# Patient Record
Sex: Male | Born: 1970 | Race: Black or African American | Hispanic: No | Smoking: Former smoker
Health system: Southern US, Community
[De-identification: ages and names within clinical notes are randomized; demographics above are authoritative.]

## PROBLEM LIST (undated history)

## (undated) DIAGNOSIS — IMO0002 Reserved for concepts with insufficient information to code with codable children: Secondary | ICD-10-CM

## (undated) DIAGNOSIS — K219 Gastro-esophageal reflux disease without esophagitis: Secondary | ICD-10-CM

## (undated) HISTORY — PX: BACK SURGERY: SHX140

## (undated) HISTORY — PX: OTHER SURGICAL HISTORY: SHX169

## (undated) HISTORY — PX: SPINE SURGERY: SHX786

---

## 1998-03-23 ENCOUNTER — Encounter: Payer: Self-pay | Admitting: Emergency Medicine

## 1998-03-23 ENCOUNTER — Emergency Department (HOSPITAL_COMMUNITY): Admission: EM | Admit: 1998-03-23 | Discharge: 1998-03-23 | Payer: Self-pay | Admitting: Emergency Medicine

## 1998-03-27 ENCOUNTER — Encounter: Payer: Self-pay | Admitting: Endocrinology

## 1998-03-27 ENCOUNTER — Emergency Department (HOSPITAL_COMMUNITY): Admission: EM | Admit: 1998-03-27 | Discharge: 1998-03-27 | Payer: Self-pay | Admitting: Endocrinology

## 1998-04-26 ENCOUNTER — Ambulatory Visit (HOSPITAL_COMMUNITY): Admission: RE | Admit: 1998-04-26 | Discharge: 1998-04-26 | Payer: Self-pay | Admitting: Chiropractic Medicine

## 1998-04-26 ENCOUNTER — Encounter: Payer: Self-pay | Admitting: Chiropractic Medicine

## 1998-06-10 ENCOUNTER — Ambulatory Visit (HOSPITAL_COMMUNITY): Admission: RE | Admit: 1998-06-10 | Discharge: 1998-06-10 | Payer: Self-pay | Admitting: Orthopedic Surgery

## 1998-06-10 ENCOUNTER — Encounter: Payer: Self-pay | Admitting: Orthopedic Surgery

## 1998-07-27 ENCOUNTER — Encounter: Admission: RE | Admit: 1998-07-27 | Discharge: 1998-10-25 | Payer: Self-pay | Admitting: Neurosurgery

## 1998-08-24 ENCOUNTER — Ambulatory Visit (HOSPITAL_COMMUNITY): Admission: RE | Admit: 1998-08-24 | Discharge: 1998-08-24 | Payer: Self-pay | Admitting: Neurosurgery

## 1998-09-28 ENCOUNTER — Inpatient Hospital Stay (HOSPITAL_COMMUNITY): Admission: RE | Admit: 1998-09-28 | Discharge: 1998-09-29 | Payer: Self-pay | Admitting: Neurosurgery

## 1999-01-12 ENCOUNTER — Emergency Department (HOSPITAL_COMMUNITY): Admission: EM | Admit: 1999-01-12 | Discharge: 1999-01-13 | Payer: Self-pay | Admitting: Emergency Medicine

## 1999-01-13 ENCOUNTER — Encounter: Admission: RE | Admit: 1999-01-13 | Discharge: 1999-01-13 | Payer: Self-pay | Admitting: Neurosurgery

## 1999-01-13 ENCOUNTER — Encounter: Payer: Self-pay | Admitting: Emergency Medicine

## 1999-01-31 ENCOUNTER — Ambulatory Visit (HOSPITAL_COMMUNITY): Admission: RE | Admit: 1999-01-31 | Discharge: 1999-01-31 | Payer: Self-pay | Admitting: Neurosurgery

## 1999-02-13 ENCOUNTER — Encounter: Admission: RE | Admit: 1999-02-13 | Discharge: 1999-03-02 | Payer: Self-pay | Admitting: Neurosurgery

## 2000-12-12 ENCOUNTER — Emergency Department (HOSPITAL_COMMUNITY): Admission: EM | Admit: 2000-12-12 | Discharge: 2000-12-13 | Payer: Self-pay | Admitting: Emergency Medicine

## 2000-12-13 ENCOUNTER — Encounter: Payer: Self-pay | Admitting: Emergency Medicine

## 2001-03-09 ENCOUNTER — Emergency Department (HOSPITAL_COMMUNITY): Admission: EM | Admit: 2001-03-09 | Discharge: 2001-03-09 | Payer: Self-pay | Admitting: Emergency Medicine

## 2001-03-09 ENCOUNTER — Encounter: Payer: Self-pay | Admitting: Emergency Medicine

## 2001-07-17 ENCOUNTER — Emergency Department (HOSPITAL_COMMUNITY): Admission: EM | Admit: 2001-07-17 | Discharge: 2001-07-17 | Payer: Self-pay

## 2001-08-28 ENCOUNTER — Emergency Department (HOSPITAL_COMMUNITY): Admission: EM | Admit: 2001-08-28 | Discharge: 2001-08-29 | Payer: Self-pay | Admitting: Emergency Medicine

## 2001-08-29 ENCOUNTER — Encounter: Payer: Self-pay | Admitting: Emergency Medicine

## 2001-12-08 ENCOUNTER — Emergency Department (HOSPITAL_COMMUNITY): Admission: EM | Admit: 2001-12-08 | Discharge: 2001-12-09 | Payer: Self-pay | Admitting: Emergency Medicine

## 2001-12-09 ENCOUNTER — Encounter: Payer: Self-pay | Admitting: Emergency Medicine

## 2002-01-22 ENCOUNTER — Ambulatory Visit (HOSPITAL_BASED_OUTPATIENT_CLINIC_OR_DEPARTMENT_OTHER): Admission: RE | Admit: 2002-01-22 | Discharge: 2002-01-23 | Payer: Self-pay | Admitting: Orthopedic Surgery

## 2003-11-24 ENCOUNTER — Emergency Department (HOSPITAL_COMMUNITY): Admission: EM | Admit: 2003-11-24 | Discharge: 2003-11-25 | Payer: Self-pay | Admitting: Emergency Medicine

## 2004-02-16 ENCOUNTER — Emergency Department (HOSPITAL_COMMUNITY): Admission: EM | Admit: 2004-02-16 | Discharge: 2004-02-16 | Payer: Self-pay | Admitting: Emergency Medicine

## 2004-03-31 ENCOUNTER — Emergency Department (HOSPITAL_COMMUNITY): Admission: EM | Admit: 2004-03-31 | Discharge: 2004-03-31 | Payer: Self-pay | Admitting: Emergency Medicine

## 2004-09-25 ENCOUNTER — Emergency Department (HOSPITAL_COMMUNITY): Admission: EM | Admit: 2004-09-25 | Discharge: 2004-09-25 | Payer: Self-pay | Admitting: Emergency Medicine

## 2004-10-08 ENCOUNTER — Emergency Department (HOSPITAL_COMMUNITY): Admission: EM | Admit: 2004-10-08 | Discharge: 2004-10-08 | Payer: Self-pay | Admitting: Emergency Medicine

## 2005-06-22 ENCOUNTER — Emergency Department (HOSPITAL_COMMUNITY): Admission: EM | Admit: 2005-06-22 | Discharge: 2005-06-22 | Payer: Self-pay | Admitting: Emergency Medicine

## 2006-07-10 ENCOUNTER — Emergency Department (HOSPITAL_COMMUNITY): Admission: EM | Admit: 2006-07-10 | Discharge: 2006-07-10 | Payer: Self-pay | Admitting: Family Medicine

## 2006-11-20 ENCOUNTER — Emergency Department (HOSPITAL_COMMUNITY): Admission: EM | Admit: 2006-11-20 | Discharge: 2006-11-20 | Payer: Self-pay | Admitting: Emergency Medicine

## 2006-12-08 ENCOUNTER — Emergency Department (HOSPITAL_COMMUNITY): Admission: EM | Admit: 2006-12-08 | Discharge: 2006-12-08 | Payer: Self-pay | Admitting: Emergency Medicine

## 2007-07-09 ENCOUNTER — Emergency Department (HOSPITAL_COMMUNITY): Admission: EM | Admit: 2007-07-09 | Discharge: 2007-07-09 | Payer: Self-pay | Admitting: Emergency Medicine

## 2007-07-21 ENCOUNTER — Encounter: Admission: RE | Admit: 2007-07-21 | Discharge: 2007-07-21 | Payer: Self-pay | Admitting: Sports Medicine

## 2007-07-22 ENCOUNTER — Encounter: Admission: RE | Admit: 2007-07-22 | Discharge: 2007-07-22 | Payer: Self-pay | Admitting: Sports Medicine

## 2007-09-10 ENCOUNTER — Emergency Department (HOSPITAL_COMMUNITY): Admission: EM | Admit: 2007-09-10 | Discharge: 2007-09-10 | Payer: Self-pay | Admitting: Emergency Medicine

## 2008-09-04 ENCOUNTER — Emergency Department (HOSPITAL_COMMUNITY): Admission: EM | Admit: 2008-09-04 | Discharge: 2008-09-04 | Payer: Self-pay | Admitting: Emergency Medicine

## 2009-05-24 ENCOUNTER — Emergency Department (HOSPITAL_COMMUNITY): Admission: EM | Admit: 2009-05-24 | Discharge: 2009-05-25 | Payer: Self-pay | Admitting: Emergency Medicine

## 2009-09-11 ENCOUNTER — Emergency Department (HOSPITAL_COMMUNITY): Admission: EM | Admit: 2009-09-11 | Discharge: 2009-09-11 | Payer: Self-pay | Admitting: Emergency Medicine

## 2010-05-31 ENCOUNTER — Emergency Department (HOSPITAL_COMMUNITY): Payer: 59

## 2010-05-31 ENCOUNTER — Emergency Department (HOSPITAL_COMMUNITY)
Admission: EM | Admit: 2010-05-31 | Discharge: 2010-05-31 | Disposition: A | Discharge status: Home / Self Care | Payer: 59 | Attending: Emergency Medicine | Admitting: Emergency Medicine

## 2010-05-31 DIAGNOSIS — M79609 Pain in unspecified limb: Secondary | ICD-10-CM | POA: Insufficient documentation

## 2010-05-31 DIAGNOSIS — W219XXA Striking against or struck by unspecified sports equipment, initial encounter: Secondary | ICD-10-CM | POA: Insufficient documentation

## 2010-05-31 DIAGNOSIS — S40019A Contusion of unspecified shoulder, initial encounter: Secondary | ICD-10-CM | POA: Insufficient documentation

## 2010-05-31 DIAGNOSIS — Y9367 Activity, basketball: Secondary | ICD-10-CM | POA: Insufficient documentation

## 2010-05-31 DIAGNOSIS — K219 Gastro-esophageal reflux disease without esophagitis: Secondary | ICD-10-CM | POA: Insufficient documentation

## 2010-05-31 DIAGNOSIS — S62309A Unspecified fracture of unspecified metacarpal bone, initial encounter for closed fracture: Secondary | ICD-10-CM | POA: Insufficient documentation

## 2010-05-31 DIAGNOSIS — M25519 Pain in unspecified shoulder: Secondary | ICD-10-CM | POA: Insufficient documentation

## 2010-06-04 LAB — POCT I-STAT, CHEM 8
BUN: 11 mg/dL (ref 6–23)
Calcium, Ion: 1.15 mmol/L (ref 1.12–1.32)
Chloride: 106 meq/L (ref 96–112)
Creatinine, Ser: 1.1 mg/dL (ref 0.4–1.5)
Glucose, Bld: 105 mg/dL — ABNORMAL HIGH (ref 70–99)
HCT: 44 % (ref 39.0–52.0)
Hemoglobin: 15 g/dL (ref 13.0–17.0)
Potassium: 3.8 meq/L (ref 3.5–5.1)
Sodium: 141 meq/L (ref 135–145)
TCO2: 27 mmol/L (ref 0–100)

## 2010-06-04 LAB — DIFFERENTIAL
Basophils Absolute: 0.1 K/uL (ref 0.0–0.1)
Basophils Relative: 1 % (ref 0–1)
Eosinophils Absolute: 0.3 K/uL (ref 0.0–0.7)
Eosinophils Relative: 3 % (ref 0–5)
Lymphocytes Relative: 33 % (ref 12–46)
Lymphs Abs: 3 K/uL (ref 0.7–4.0)
Monocytes Absolute: 0.5 K/uL (ref 0.1–1.0)
Monocytes Relative: 6 % (ref 3–12)
Neutro Abs: 5.2 K/uL (ref 1.7–7.7)
Neutrophils Relative %: 57 % (ref 43–77)

## 2010-06-04 LAB — CBC
HCT: 44.3 % (ref 39.0–52.0)
MCHC: 32.2 g/dL (ref 30.0–36.0)
MCV: 88.5 fL (ref 78.0–100.0)
Platelets: 234 10*3/uL (ref 150–400)
RBC: 5.01 MIL/uL (ref 4.22–5.81)
RDW: 14.3 % (ref 11.5–15.5)

## 2010-06-19 LAB — COMPREHENSIVE METABOLIC PANEL
AST: 20 U/L (ref 0–37)
BUN: 6 mg/dL (ref 6–23)
CO2: 27 mEq/L (ref 19–32)
Calcium: 9.3 mg/dL (ref 8.4–10.5)
Creatinine, Ser: 1.07 mg/dL (ref 0.4–1.5)
GFR calc Af Amer: >60 mL/min (ref >60)
GFR calc non Af Amer: >60 mL/min (ref >60)
Total Bilirubin: 0.6 mg/dL (ref 0.3–1.2)

## 2010-06-19 LAB — POCT CARDIAC MARKERS: Myoglobin, poc: 63.2 ng/mL (ref 12–200)

## 2010-06-19 LAB — DIFFERENTIAL
Basophils Absolute: 0 10*3/uL (ref 0.0–0.1)
Eosinophils Relative: 3 % (ref 0–5)
Lymphs Abs: 3 10*3/uL (ref 0.7–4.0)
Neutro Abs: 4.2 10*3/uL (ref 1.7–7.7)

## 2010-06-19 LAB — CBC
MCV: 87.5 fL (ref 78.0–100.0)
RBC: 4.93 MIL/uL (ref 4.22–5.81)
RDW: 14.1 % (ref 11.5–15.5)
WBC: 7.9 10*3/uL (ref 4.0–10.5)

## 2010-07-28 NOTE — Op Note (Signed)
NAME:  Tristan Ware, Tristan Ware                        ACCOUNT NO.:  [id number]   MEDICAL RECORD NO.:  [id number]                   PATIENT TYPE:  AMB   LOCATION:  DSC                                  FACILITY:  [facility]   PHYSICIAN:  Loreta Ave, M.D.              DATE OF BIRTH:  [birth year]   DATE OF PROCEDURE:  01/22/2002  DATE OF DISCHARGE:                                 OPERATIVE REPORT   PREOPERATIVE DIAGNOSES:  1. Extensive partial tearing central third quadriceps tendon at superior     patella.  2. Medial meniscus tear, right knee.   POSTOPERATIVE DIAGNOSES:  1. Extensive partial tearing central third quadriceps tendon at superior     patella.  2. Medial meniscus tear, right knee.   OPERATION PERFORMED:  1. Right knee examination under anesthesia and arthroscopy, partial medial     meniscectomy, right knee.  2. Open extensive debridement and then repair of patellar tendon at superior     pole of patella, right knee.   SURGEON:  Loreta Ave, M.D.   ASSISTANT:  Arlys John D. Petrarca, P.A.-C.   ANESTHESIA:  General.   ESTIMATED BLOOD LOSS:  Minimal.   TOURNIQUET TIME:  One hour.   SPECIMENS:  None.   CULTURES:  None.   COMPLICATIONS:  None.   DRESSING:  Soft compressive with knee immobilizer.   DESCRIPTION OF PROCEDURE:  The patient was brought to the operating room and  placed on the operating room in supine position.  After adequate anesthesia  had been obtained, right knee examined.  Full motion, good stability.  Slightly palpable defect top of the patella.  Negative Lachman, negative  drawer.  Tourniquet and leg holder applied.  Leg prepped and draped in the  usual sterile fashion.  Exsanguinated with elevation and Esmarch.  Tourniquet inflated to 350 mmHg.  Straight incision at the top of the  patella extending superior and inferior. Skin and subcutaneous tissue were  divided.  Middle third quadriceps tendon exposed.  Incised longitudinally,  exposing  extensive tearing of the entire middle third of the tendon almost  full thickness with some retraction.  Bony ossicles, calcification, mucinous  degeneration and inflammatory debris.  All thoroughly excised from the  entire area.  This involved almost the entire middle third of the tendon.  I  had cleanly debrided by to healthy tissue throughout.  Top of patella  treated with multiple drillings to allow later healing.  Wound copiously  irrigated.  The tendon was then reapproximated side-to-side and brought down  overtop the patella at the site of the defect.  Nice firm longitudinal  continuity throughout after repair.  I could bring it through full passive  motion without undue tension on the repair.  Wound irrigated and closed with  subcutaneous and subcuticular Vicryl.  Three portals then created, one  superolateral, one each medial and lateral parapatellar.  Inflow catheter  introduced, knee distended,  arthroscope introduced knee inspected.  A little  lateral patellofemoral tracking but no tethering.  Articular cartilage  intact throughout.  Complex tearing posterior third medial meniscus.  Bucket  handle and transverse type tear not repairable.  Posterior third saucerized  out to healthy tissue, tapered in to remaining meniscus.  Remaining knee  inspected.  Articular cartilage intact.  Previous partial lateral  meniscectomy but no new tears. Some inflammatory and adhesions that would  probably need debrided.  All recesses examined.  No other findings  appreciated.  Instruments and fluid removed.  Portals of the knee injected  with Marcaine.  The incision above injected with Marcaine. Portals closed  with nylon.  Sterile compressive dressing applied throughout.  Tourniquet  deflated and removed.  Knee immobilizer applied.  Anesthesia reversed.  Brought to recovery room.  Tolerated surgery well.  No complications.                                               Loreta Ave,  M.D.    DFM/MEDQ  D:  01/22/2002  T:  01/22/2002  Job:  [id number]

## 2010-09-03 ENCOUNTER — Emergency Department (HOSPITAL_COMMUNITY)
Admission: EM | Admit: 2010-09-03 | Discharge: 2010-09-03 | Disposition: A | Discharge status: Home / Self Care | Payer: 59 | Attending: Emergency Medicine | Admitting: Emergency Medicine

## 2010-09-03 DIAGNOSIS — R599 Enlarged lymph nodes, unspecified: Secondary | ICD-10-CM | POA: Insufficient documentation

## 2010-09-03 DIAGNOSIS — J069 Acute upper respiratory infection, unspecified: Secondary | ICD-10-CM | POA: Insufficient documentation

## 2010-09-03 DIAGNOSIS — H9209 Otalgia, unspecified ear: Secondary | ICD-10-CM | POA: Insufficient documentation

## 2011-07-31 ENCOUNTER — Encounter (HOSPITAL_COMMUNITY): Payer: Self-pay | Admitting: Emergency Medicine

## 2011-07-31 ENCOUNTER — Emergency Department (HOSPITAL_COMMUNITY)
Admission: EM | Admit: 2011-07-31 | Discharge: 2011-07-31 | Disposition: A | Discharge status: Home / Self Care | Payer: 59 | Source: Home / Self Care | Attending: Family Medicine | Admitting: Family Medicine

## 2011-07-31 DIAGNOSIS — K922 Gastrointestinal hemorrhage, unspecified: Secondary | ICD-10-CM

## 2011-07-31 HISTORY — DX: Reserved for concepts with insufficient information to code with codable children: IMO0002

## 2011-07-31 HISTORY — DX: Gastro-esophageal reflux disease without esophagitis: K21.9

## 2011-07-31 LAB — POCT I-STAT, CHEM 8
BUN: 8 mg/dL (ref 6–23)
Creatinine, Ser: 1.2 mg/dL (ref 0.50–1.35)
Glucose, Bld: 92 mg/dL (ref 70–99)
HCT: 49 % (ref 39.0–52.0)
Sodium: 143 mEq/L (ref 135–145)
TCO2: 25 mmol/L (ref 0–100)

## 2011-07-31 NOTE — Discharge Instructions (Signed)
Continue your protonix, no more smoking, ok to work as planned, see dr Loreta Ave next week for recheck, sooner if any problems.
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
## 2011-07-31 NOTE — ED Provider Notes (Signed)
History     CSN: [id number]  Arrival date & time 07/31/11  1016   First MD Initiated Contact with Patient 07/31/11 1113      Chief Complaint  Patient presents with  . Follow-up  . GI Bleeding    (Consider location/radiation/quality/duration/timing/severity/associated sxs/prior treatment) Patient is a 41 y.o. male presenting with abdominal pain. The history is provided by the patient.  Abdominal Pain The primary symptoms of the illness include abdominal pain and hematemesis. Primary symptoms comment: seen 5/15 in [place] for vomiting blood, and black stool, pt is truck driver, h/o endo and colon by dr Loreta Ave 2 yr ago --all neg. The onset of the illness was sudden. The problem has been resolved (all sx resolved, stool nl , no abd pain.).  The patient has not had a change in bowel habit. Risk factors: smoker-- quit recently.    Past Medical History  Diagnosis Date  . Ulcer   . Acid reflux     History reviewed. No pertinent past surgical history.  No family history on file.  History  Substance Use Topics  . Smoking status: Former Smoker    Quit date: 07/29/2011  . Smokeless tobacco: Not on file  . Alcohol Use: No      Review of Systems  Constitutional: Negative.   Gastrointestinal: Positive for abdominal pain and hematemesis.    Allergies  Review of patient's allergies indicates no known allergies.  Home Medications   Current Outpatient Rx  Name Route Sig Dispense Refill  . PANTOPRAZOLE SODIUM 40 MG PO TBEC Oral Take 40 mg by mouth daily.      BP 119/78  Pulse 80  Temp(Src) 98.1 F (36.7 C) (Oral)  Resp 16  SpO2 95%  Physical Exam  Nursing note and vitals reviewed. Constitutional: He is oriented to person, place, and time. He appears well-developed and well-nourished.  HENT:  Head: Normocephalic.  Mouth/Throat: Oropharynx is clear and moist.  Cardiovascular: Regular rhythm.   Pulmonary/Chest: Breath sounds normal.  Abdominal: Soft. Bowel sounds are  normal. He exhibits no distension and no mass. There is no tenderness. There is no rebound and no guarding.  Neurological: He is alert and oriented to person, place, and time.  Skin: Skin is warm and dry.    ED Course  Procedures (including critical care time)   Labs Reviewed  POCT I-STAT, CHEM 8   No results found.   1. Upper GI bleeding       MDM          Linna Hoff, MD 07/31/11 1302

## 2011-08-02 ENCOUNTER — Telehealth (HOSPITAL_COMMUNITY): Payer: Self-pay | Admitting: *Deleted

## 2011-08-02 NOTE — ED Notes (Signed)
Cindy from Dr. Kenna Gilbert office called in VM @ [phone] and asked for pt.'s recent lab results. I printed them and faxed them @ 1700 and confirmation received. I called Cindy back and left message through the answering service that it had been done. Vassie Moselle 08/02/2011

## 2011-11-06 ENCOUNTER — Emergency Department (HOSPITAL_COMMUNITY)
Admission: EM | Admit: 2011-11-06 | Discharge: 2011-11-06 | Disposition: A | Discharge status: Home / Self Care | Payer: Worker's Compensation | Attending: Emergency Medicine | Admitting: Emergency Medicine

## 2011-11-06 ENCOUNTER — Encounter (HOSPITAL_COMMUNITY): Payer: Self-pay | Admitting: Emergency Medicine

## 2011-11-06 ENCOUNTER — Emergency Department (HOSPITAL_COMMUNITY): Payer: Worker's Compensation

## 2011-11-06 DIAGNOSIS — M25519 Pain in unspecified shoulder: Secondary | ICD-10-CM | POA: Insufficient documentation

## 2011-11-06 DIAGNOSIS — S01409A Unspecified open wound of unspecified cheek and temporomandibular area, initial encounter: Secondary | ICD-10-CM | POA: Insufficient documentation

## 2011-11-06 DIAGNOSIS — Z87891 Personal history of nicotine dependence: Secondary | ICD-10-CM | POA: Insufficient documentation

## 2011-11-06 DIAGNOSIS — IMO0002 Reserved for concepts with insufficient information to code with codable children: Secondary | ICD-10-CM | POA: Insufficient documentation

## 2011-11-06 DIAGNOSIS — K219 Gastro-esophageal reflux disease without esophagitis: Secondary | ICD-10-CM | POA: Insufficient documentation

## 2011-11-06 MED ORDER — NAPROXEN 500 MG PO TABS: 500.0000 mg | ORAL_TABLET | Freq: Two times a day (BID) | ORAL | Status: DC

## 2011-11-06 MED ORDER — OXYCODONE-ACETAMINOPHEN 5-325 MG PO TABS: 1.0000 | ORAL_TABLET | Freq: Four times a day (QID) | ORAL | Status: DC | PRN

## 2011-11-06 MED ORDER — IBUPROFEN 800 MG PO TABS
800.0000 mg | ORAL_TABLET | Freq: Once | ORAL | Status: DC
  Filled 2011-11-06: qty 1

## 2011-11-06 MED ORDER — OXYCODONE-ACETAMINOPHEN 5-325 MG PO TABS
1.0000 | ORAL_TABLET | Freq: Once | ORAL | Status: DC
  Filled 2011-11-06: qty 1
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
## 2011-11-06 NOTE — ED Notes (Signed)
Pt's supervisor was speaking with pt on phone and pt handed phone to this RN. 

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
## 2011-11-07 NOTE — ED Provider Notes (Signed)
Medical screening examination/treatment/procedure(s) were performed by non-physician practitioner and as supervising physician I was immediately available for consultation/collaboration.  Lancer Thurner R. Obbie Lewallen, MD 11/07/11 0007 

## 2011-11-15 ENCOUNTER — Encounter (HOSPITAL_COMMUNITY): Payer: Self-pay | Admitting: Emergency Medicine

## 2011-11-15 ENCOUNTER — Emergency Department (HOSPITAL_COMMUNITY)
Admission: EM | Admit: 2011-11-15 | Discharge: 2011-11-15 | Disposition: A | Discharge status: Home / Self Care | Payer: Worker's Compensation | Attending: Emergency Medicine | Admitting: Emergency Medicine

## 2011-11-15 ENCOUNTER — Emergency Department (HOSPITAL_COMMUNITY): Payer: Worker's Compensation

## 2011-11-15 DIAGNOSIS — K219 Gastro-esophageal reflux disease without esophagitis: Secondary | ICD-10-CM | POA: Insufficient documentation

## 2011-11-15 DIAGNOSIS — M25519 Pain in unspecified shoulder: Secondary | ICD-10-CM | POA: Insufficient documentation

## 2011-11-15 DIAGNOSIS — Z87891 Personal history of nicotine dependence: Secondary | ICD-10-CM | POA: Insufficient documentation

## 2011-11-15 DIAGNOSIS — T07XXXA Unspecified multiple injuries, initial encounter: Secondary | ICD-10-CM

## 2011-11-15 MED ORDER — METHOCARBAMOL 500 MG PO TABS: 500.0000 mg | ORAL_TABLET | Freq: Two times a day (BID) | ORAL | Status: AC

## 2011-11-15 MED ORDER — OXYCODONE-ACETAMINOPHEN 5-325 MG PO TABS: 2.0000 | ORAL_TABLET | ORAL | Status: AC | PRN
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
## 2012-01-03 ENCOUNTER — Other Ambulatory Visit: Payer: Self-pay | Admitting: Sports Medicine

## 2012-01-03 DIAGNOSIS — M25521 Pain in right elbow: Secondary | ICD-10-CM

## 2012-01-08 ENCOUNTER — Ambulatory Visit
Admission: RE | Admit: 2012-01-08 | Discharge: 2012-01-08 | Disposition: A | Discharge status: Home / Self Care | Payer: 59 | Source: Ambulatory Visit | Attending: Sports Medicine | Admitting: Sports Medicine

## 2012-01-08 DIAGNOSIS — M25521 Pain in right elbow: Secondary | ICD-10-CM

## 2012-05-25 ENCOUNTER — Encounter (HOSPITAL_COMMUNITY): Payer: Self-pay | Admitting: *Deleted

## 2012-05-25 ENCOUNTER — Emergency Department (HOSPITAL_COMMUNITY)
Admission: EM | Admit: 2012-05-25 | Discharge: 2012-05-25 | Disposition: A | Discharge status: Home / Self Care | Payer: 59 | Source: Home / Self Care | Attending: Emergency Medicine | Admitting: Emergency Medicine

## 2012-05-25 DIAGNOSIS — S00511A Abrasion of lip, initial encounter: Secondary | ICD-10-CM

## 2012-05-25 DIAGNOSIS — IMO0002 Reserved for concepts with insufficient information to code with codable children: Secondary | ICD-10-CM

## 2012-05-25 MED ORDER — TETANUS-DIPHTH-ACELL PERTUSSIS 5-2.5-18.5 LF-MCG/0.5 IM SUSP
0.5000 mL | Freq: Once | INTRAMUSCULAR | Status: AC
  Administered 2012-05-25: 0.5 mL via INTRAMUSCULAR

## 2012-05-25 MED ORDER — TETANUS-DIPHTH-ACELL PERTUSSIS 5-2.5-18.5 LF-MCG/0.5 IM SUSP
INTRAMUSCULAR | Status: AC
  Filled 2012-05-25: qty 0.5

## 2012-05-25 NOTE — ED Provider Notes (Signed)
History     CSN: [id number]  Arrival date & time 05/25/12  1649   First MD Initiated Contact with Patient 05/25/12 1710      Chief Complaint  Patient presents with  . Oral Pain    HPI Patient is a 42 y.o. male presenting with mouth pain. The history is provided by the patient.  Oral Pain This is a new problem. The current episode started more than 2 days ago. The problem has been gradually improving. He has tried nothing for the symptoms.  Pt states he was eating at a local restaurant Friday when he noticed his (L) upper lip bleeding. He apparently cut his lip on a rough sharp edge that protruded from his fork. Since that time he continue to feel a sting at the sight. His family suggested he should be concerned since his tetanus is not UTP.   Past Medical History  Diagnosis Date  . Ulcer   . Acid reflux     Past Surgical History  Procedure Laterality Date  . Back surgery    . Bilateral knee surgery      No family history on file.  History  Substance Use Topics  . Smoking status: Former Smoker    Quit date: 07/29/2011  . Smokeless tobacco: Not on file  . Alcohol Use: No      Review of Systems  All other systems reviewed and are negative.    Allergies  Aspirin  Home Medications   Current Outpatient Rx  Name  Route  Sig  Dispense  Refill  . Multiple Vitamin (MULTIVITAMIN WITH MINERALS) TABS   Oral   Take 1 tablet by mouth daily.         . naproxen (NAPROSYN) 500 MG tablet   Oral   Take 500 mg by mouth 2 (two) times daily.           BP 130/87  Pulse 64  Temp(Src) 97.8 F (36.6 C) (Oral)  Resp 17  SpO2 100%  Physical Exam  Constitutional: He is oriented to person, place, and time. He appears well-developed and well-nourished.  HENT:  Head: Normocephalic and atraumatic.  Mouth/Throat:    Very small abrasion to inner aspect of (L) upper lip. Minimal swelling. No s&s to suggest infectious process.   Eyes: Conjunctivae are normal.  Neck:  Neck supple.  Cardiovascular: Normal rate.   Pulmonary/Chest: Effort normal.  Musculoskeletal: Normal range of motion.  Neurological: He is alert and oriented to person, place, and time.  Skin: Skin is warm and dry.  Psychiatric: He has a normal mood and affect.    ED Course  Procedures (including critical care time)  Labs Reviewed - No data to display No results found.   1. Lip abrasion, initial encounter       MDM  Abrasion to inner aspect of (L) upper lip that pt sustained on a fork w/ a rough edge on Friday. Pt essentially wants tetanus updated but also wants to know what to do for the wound. T-Dap updated. Pt instructed to swish several times a day w/ 1/2 H202 and water solution until wound closes. Pt verbalizes understanding.        Leanne Chang, NP 05/27/12 [phone]
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
## 2012-05-27 NOTE — ED Provider Notes (Signed)
Medical screening examination/treatment/procedure(s) were performed by non-physician practitioner and as supervising physician I was immediately available for consultation/collaboration.  Leslee Home, M.D.  Reuben Likes, MD 05/27/12 [phone]
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
## 2014-12-13 ENCOUNTER — Ambulatory Visit (INDEPENDENT_AMBULATORY_CARE_PROVIDER_SITE_OTHER): Payer: 59

## 2014-12-13 ENCOUNTER — Ambulatory Visit (INDEPENDENT_AMBULATORY_CARE_PROVIDER_SITE_OTHER): Payer: 59 | Admitting: Emergency Medicine

## 2014-12-13 VITALS — BP 148/80 | HR 58 | Temp 98.5°F | Resp 16 | Ht 73.0 in | Wt 234.0 lb

## 2014-12-13 DIAGNOSIS — Z202 Contact with and (suspected) exposure to infections with a predominantly sexual mode of transmission: Secondary | ICD-10-CM | POA: Diagnosis not present

## 2014-12-13 DIAGNOSIS — M79644 Pain in right finger(s): Secondary | ICD-10-CM | POA: Diagnosis not present

## 2014-12-13 DIAGNOSIS — S63632A Sprain of interphalangeal joint of right middle finger, initial encounter: Secondary | ICD-10-CM | POA: Diagnosis not present

## 2014-12-13 MED ORDER — METRONIDAZOLE 500 MG PO TABS: 2000.0000 mg | ORAL_TABLET | Freq: Once | ORAL | Status: DC

## 2014-12-13 NOTE — Patient Instructions (Signed)
Trichomoniasis Trichomoniasis is an infection caused by an organism called Trichomonas. The infection can affect both women and men. In women, the outer male genitalia and the vagina are affected. In men, the penis is mainly affected, but the prostate and other reproductive organs can also be involved. Trichomoniasis is a sexually transmitted infection (STI) and is most often passed to another person through sexual contact.  RISK FACTORS  Having unprotected sexual intercourse.  Having sexual intercourse with an infected partner. SIGNS AND SYMPTOMS  Symptoms of trichomoniasis in women include:  Abnormal gray-green frothy vaginal discharge.  Itching and irritation of the vagina.  Itching and irritation of the area outside the vagina. Symptoms of trichomoniasis in men include:   Penile discharge with or without pain.  Pain during urination. This results from inflammation of the urethra. DIAGNOSIS  Trichomoniasis may be found during a Pap test or physical exam. Your health care provider may use one of the following methods to help diagnose this infection:  Examining vaginal discharge under a microscope. For men, urethral discharge would be examined.  Testing the pH of the vagina with a test tape.  Using a vaginal swab test that checks for the Trichomonas organism. A test is available that provides results within a few minutes.  Doing a culture test for the organism. This is not usually needed. TREATMENT   You may be given medicine to fight the infection. Women should inform their health care provider if they could be or are pregnant. Some medicines used to treat the infection should not be taken during pregnancy.  Your health care provider may recommend over-the-counter medicines or creams to decrease itching or irritation.  Your sexual partner will need to be treated if infected. HOME CARE INSTRUCTIONS   Take medicines only as directed by your health care provider.  Take  over-the-counter medicine for itching or irritation as directed by your health care provider.  Do not have sexual intercourse while you have the infection.  Women should not douche or wear tampons while they have the infection.  Discuss your infection with your partner. Your partner may have gotten the infection from you, or you may have gotten it from your partner.  Have your sex partner get examined and treated if necessary.  Practice safe, informed, and protected sex.  See your health care provider for other STI testing. SEEK MEDICAL CARE IF:   You still have symptoms after you finish your medicine.  You develop abdominal pain.  You have pain when you urinate.  You have bleeding after sexual intercourse.  You develop a rash.  Your medicine makes you sick or makes you throw up (vomit). MAKE SURE YOU:  Understand these instructions.  Will watch your condition.  Will get help right away if you are not doing well or get worse. Document Released: 08/22/2000 Document Revised: 07/13/2013 Document Reviewed: 12/08/2012 ExitCare Patient Information 2015 [employer]. This information is not intended to replace advice given to you by your health care provider. Make sure you discuss any questions you have with your health care provider.  

## 2014-12-13 NOTE — Progress Notes (Signed)
Subjective:  Patient ID: Tristan Ware, male    DOB: September 10, 1970  Age: 44 y.o. MRN: [id number]  CC: Hand Pain and SEXUALLY TRANSMITTED DISEASE   HPI Tristan Ware presents  With a concern about Trichomonas. His girlfriend was treated for trichomonas he's never had an STD before any discomfort testing. He also is complaining of an injury of his right third finger that was injured by a closing door. Has pain in his PIP joint he has no deformity or ecchymosis but is rather swollen and with a decreased range of motion  History Tristan Ware has a past medical history of Ulcer and Acid reflux.   He has past surgical history that includes Back surgery and bilateral knee surgery.   His  family history is not on file.  He   reports that he quit smoking about 3 years ago. He does not have any smokeless tobacco history on file. He reports that he does not drink alcohol or use illicit drugs.  Outpatient Prescriptions Prior to Visit  Medication Sig Dispense Refill  . Multiple Vitamin (MULTIVITAMIN WITH MINERALS) TABS Take 1 tablet by mouth daily.     No facility-administered medications prior to visit.    Social History   Social History  . Marital Status: Legally Separated    Spouse Name: N/A  . Number of Children: N/A  . Years of Education: N/A   Social History Main Topics  . Smoking status: Former Smoker    Quit date: 07/29/2011  . Smokeless tobacco: None  . Alcohol Use: No  . Drug Use: No  . Sexual Activity: Not Asked   Other Topics Concern  . None   Social History Narrative     Review of Systems  Constitutional: Negative for fever, chills and appetite change.  HENT: Negative for congestion, ear pain, postnasal drip, sinus pressure and sore throat.   Eyes: Negative for pain and redness.  Respiratory: Negative for cough, shortness of breath and wheezing.   Cardiovascular: Negative for leg swelling.  Gastrointestinal: Negative for nausea, vomiting, abdominal pain,  diarrhea, constipation and blood in stool.  Endocrine: Negative for polyuria.  Genitourinary: Negative for dysuria, urgency, frequency and flank pain.  Musculoskeletal: Negative for gait problem.  Skin: Negative for rash.  Neurological: Negative for weakness and headaches.  Psychiatric/Behavioral: Negative for confusion and decreased concentration. The patient is not nervous/anxious.     Objective:  BP 148/80 mmHg  Pulse 58  Temp(Src) 98.5 F (36.9 C) (Oral)  Resp 16  Ht  (1.854 m)  Wt 234 lb (106.142 kg)  BMI 30.88 kg/m2  SpO2 99%  Physical Exam  Constitutional: He is oriented to person, place, and time. He appears well-developed and well-nourished. No distress.  HENT:  Head: Normocephalic and atraumatic.  Right Ear: External ear normal.  Left Ear: External ear normal.  Nose: Nose normal.  Eyes: Conjunctivae and EOM are normal. Pupils are equal, round, and reactive to light. No scleral icterus.  Neck: Normal range of motion. Neck supple. No tracheal deviation present.  Cardiovascular: Normal rate, regular rhythm and normal heart sounds.   Pulmonary/Chest: Effort normal. No respiratory distress. He has no wheezes. He has no rales.  Abdominal: He exhibits no mass. There is no tenderness. There is no rebound and no guarding.  Musculoskeletal: He exhibits no edema.  His right third PIP joint is swollen and tender with no deformity and limited range of motion.  Lymphadenopathy:    He has no cervical adenopathy.  Neurological:  He is alert and oriented to person, place, and time. Coordination normal.  Skin: Skin is warm and dry. No rash noted.  Psychiatric: He has a normal mood and affect. His behavior is normal.      Assessment & Plan:   Tristan Ware was seen today for hand pain and sexually transmitted disease.  Diagnoses and all orders for this visit:  STD exposure -     GC/Chlamydia Probe Amp  Sprain of interphalangeal joint of right middle finger, initial  encounter -     DG Finger Middle Right; Future  Other orders -     metroNIDAZOLE (FLAGYL) 500 MG tablet; Take 4 tablets (2,000 mg total) by mouth once. DO NOT CONSUME ALCOHOL WHILE TAKING THIS MEDICATION.   I have discontinued Mr. Tristan Ware multivitamin with minerals. I am also having him start on metroNIDAZOLE.  Meds ordered this encounter  Medications  . metroNIDAZOLE (FLAGYL) 500 MG tablet    Sig: Take 4 tablets (2,000 mg total) by mouth once. DO NOT CONSUME ALCOHOL WHILE TAKING THIS MEDICATION.    Dispense:  4 tablet    Refill:  0   His finger was buddy taped and he was treated for trichomonas  Appropriate red flag conditions were discussed with the patient as well as actions that should be taken.  Patient expressed his understanding.  Follow-up: Return if symptoms worsen or fail to improve.  Carmelina Dane, MD

## 2014-12-14 LAB — GC/CHLAMYDIA PROBE AMP
CT PROBE, AMP APTIMA: NEGATIVE
GC PROBE AMP APTIMA: NEGATIVE

## 2015-02-25 ENCOUNTER — Other Ambulatory Visit (HOSPITAL_COMMUNITY)
Admission: RE | Admit: 2015-02-25 | Discharge: 2015-02-25 | Disposition: A | Discharge status: Home / Self Care | Payer: 59 | Source: Ambulatory Visit | Attending: Family Medicine | Admitting: Family Medicine

## 2015-02-25 DIAGNOSIS — Z113 Encounter for screening for infections with a predominantly sexual mode of transmission: Secondary | ICD-10-CM | POA: Diagnosis not present

## 2015-04-15 ENCOUNTER — Ambulatory Visit (INDEPENDENT_AMBULATORY_CARE_PROVIDER_SITE_OTHER): Payer: Self-pay | Admitting: Family Medicine

## 2015-04-15 VITALS — BP 126/80 | HR 72 | Temp 97.6°F | Resp 17 | Ht 73.0 in | Wt 226.0 lb

## 2015-04-15 DIAGNOSIS — Z024 Encounter for examination for driving license: Secondary | ICD-10-CM

## 2015-04-15 DIAGNOSIS — Z021 Encounter for pre-employment examination: Secondary | ICD-10-CM

## 2015-04-15 NOTE — Progress Notes (Signed)
Subjective:    Patient ID: Tristan Ware, male    DOB: 03/16/1970, 45 y.o.   MRN: [id number]  04/15/2015  Employment Physical   HPI This 45 y.o. male presents for DOT physical.  Last DOT physical 2014.     Review of Systems  Constitutional: Negative for fever, chills, diaphoresis, activity change, appetite change, fatigue and unexpected weight change.  HENT: Negative for congestion, dental problem, drooling, ear discharge, ear pain, facial swelling, hearing loss, mouth sores, nosebleeds, postnasal drip, rhinorrhea, sinus pressure, sneezing, sore throat, tinnitus, trouble swallowing and voice change.   Eyes: Negative for photophobia, pain, discharge, redness, itching and visual disturbance.  Respiratory: Negative for apnea, cough, choking, chest tightness, shortness of breath, wheezing and stridor.   Cardiovascular: Negative for chest pain, palpitations and leg swelling.  Gastrointestinal: Negative for nausea, vomiting, abdominal pain, diarrhea, constipation and blood in stool.  Endocrine: Negative for cold intolerance, heat intolerance, polydipsia, polyphagia and polyuria.  Genitourinary: Negative for dysuria, urgency, frequency, hematuria, flank pain, decreased urine volume, discharge, penile swelling, scrotal swelling, enuresis, difficulty urinating, genital sores, penile pain and testicular pain.  Musculoskeletal: Negative for myalgias, back pain, joint swelling, arthralgias, gait problem, neck pain and neck stiffness.  Skin: Negative for color change, pallor, rash and wound.  Allergic/Immunologic: Negative for environmental allergies, food allergies and immunocompromised state.  Neurological: Negative for dizziness, tremors, seizures, syncope, facial asymmetry, speech difficulty, weakness, light-headedness, numbness and headaches.  Hematological: Negative for adenopathy. Does not bruise/bleed easily.  Psychiatric/Behavioral: Negative for suicidal ideas, hallucinations, behavioral  problems, confusion, sleep disturbance, self-injury, dysphoric mood, decreased concentration and agitation. The patient is not nervous/anxious and is not hyperactive.     Past Medical History  Diagnosis Date  . Ulcer   . Acid reflux    Past Surgical History  Procedure Laterality Date  . Back surgery    . Bilateral knee surgery    . Spine surgery     Allergies  Allergen Reactions  . Aspirin     GI bleeds   No current outpatient prescriptions on file.   No current facility-administered medications for this visit.   Social History   Social History  . Marital Status: Legally Separated    Spouse Name: N/A  . Number of Children: N/A  . Years of Education: N/A   Occupational History  . Not on file.   Social History Main Topics  . Smoking status: Former Smoker    Quit date: 07/29/2011  . Smokeless tobacco: Not on file  . Alcohol Use: No  . Drug Use: No  . Sexual Activity: Not on file   Other Topics Concern  . 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         Family History  Problem Relation Age of Onset  . Diabetes Mother   . Hypertension Mother   . Heart disease Mother 29    CABG/CAD  . Stroke Father 54    CVA  . Hypertension Father   . Hyperlipidemia Father   . Diabetes Brother   . Hypertension Brother        Objective:    BP 126/80 mmHg  Pulse 72  Temp(Src) 97.6 F (  36.4 C) (Oral)  Resp 17  Ht  (1.854 m)  Wt 226 lb (102.513 kg)  BMI 29.82 kg/m2  SpO2 98% Physical Exam  Constitutional: He is oriented to person, place, and time. He appears well-developed and well-nourished. No distress.  HENT:  Head: Normocephalic and atraumatic.  Right Ear: External ear normal.  Left Ear: External ear normal.  Nose: Nose normal.    Mouth/Throat: Oropharynx is clear and moist.  Eyes: Conjunctivae and EOM are normal. Pupils are equal, round, and reactive to light.  Neck: Normal range of motion. Neck supple. Carotid bruit is not present. No thyromegaly present.  Cardiovascular: Normal rate, regular rhythm, normal heart sounds and intact distal pulses.  Exam reveals no gallop and no friction rub.   No murmur heard. Pulmonary/Chest: Effort normal and breath sounds normal. He has no wheezes. He has no rales.  Abdominal: Soft. Bowel sounds are normal. He exhibits no distension and no mass. There is no tenderness. There is no rebound and no guarding. Hernia confirmed negative in the right inguinal area and confirmed negative in the left inguinal area.  Genitourinary: Testes normal and penis normal. Circumcised.  Musculoskeletal:       Right shoulder: Normal. He exhibits normal range of motion.       Left shoulder: Normal.       Right knee: Normal. He exhibits normal range of motion.       Left knee: Normal. He exhibits normal range of motion.       Right ankle: Normal.       Left ankle: Normal.       Cervical back: Normal.       Thoracic back: Normal. He exhibits normal range of motion.       Lumbar back: Normal. He exhibits normal range of motion, no tenderness, no bony tenderness, no pain and no spasm.       Right foot: Normal.       Left foot: Normal.  Lymphadenopathy:    He has no cervical adenopathy.  Neurological: He is alert and oriented to person, place, and time. He has normal reflexes. No cranial nerve deficit. He exhibits normal muscle tone. Coordination normal.  Skin: Skin is warm and dry. No rash noted. He is not diaphoretic.  Psychiatric: He has a normal mood and affect. His behavior is normal. Judgment and thought content normal.   Results for orders placed or performed in visit on 12/13/14  GC/Chlamydia Probe Amp  Result Value Ref Range   CT Probe RNA NEGATIVE    GC Probe RNA NEGATIVE       Assessment  & Plan:   1. 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 -normal exam   No orders of the defined types were placed in this encounter.   No orders of the defined types were placed in this encounter.    No Follow-up on file.    Shanira Tine Paulita Fujita, M.D. [facility] [address] [place], [place]  [address] [phone] phone [phone] fax

## 2016-04-06 IMAGING — CR DG FINGER MIDDLE 2+V*R*
1 series · 1 of 1 positions shown · non-contrast
Comparison: None.

CLINICAL DATA: Jammed finger on door.

EXAM:
RIGHT MIDDLE FINGER 2+V

[PA]
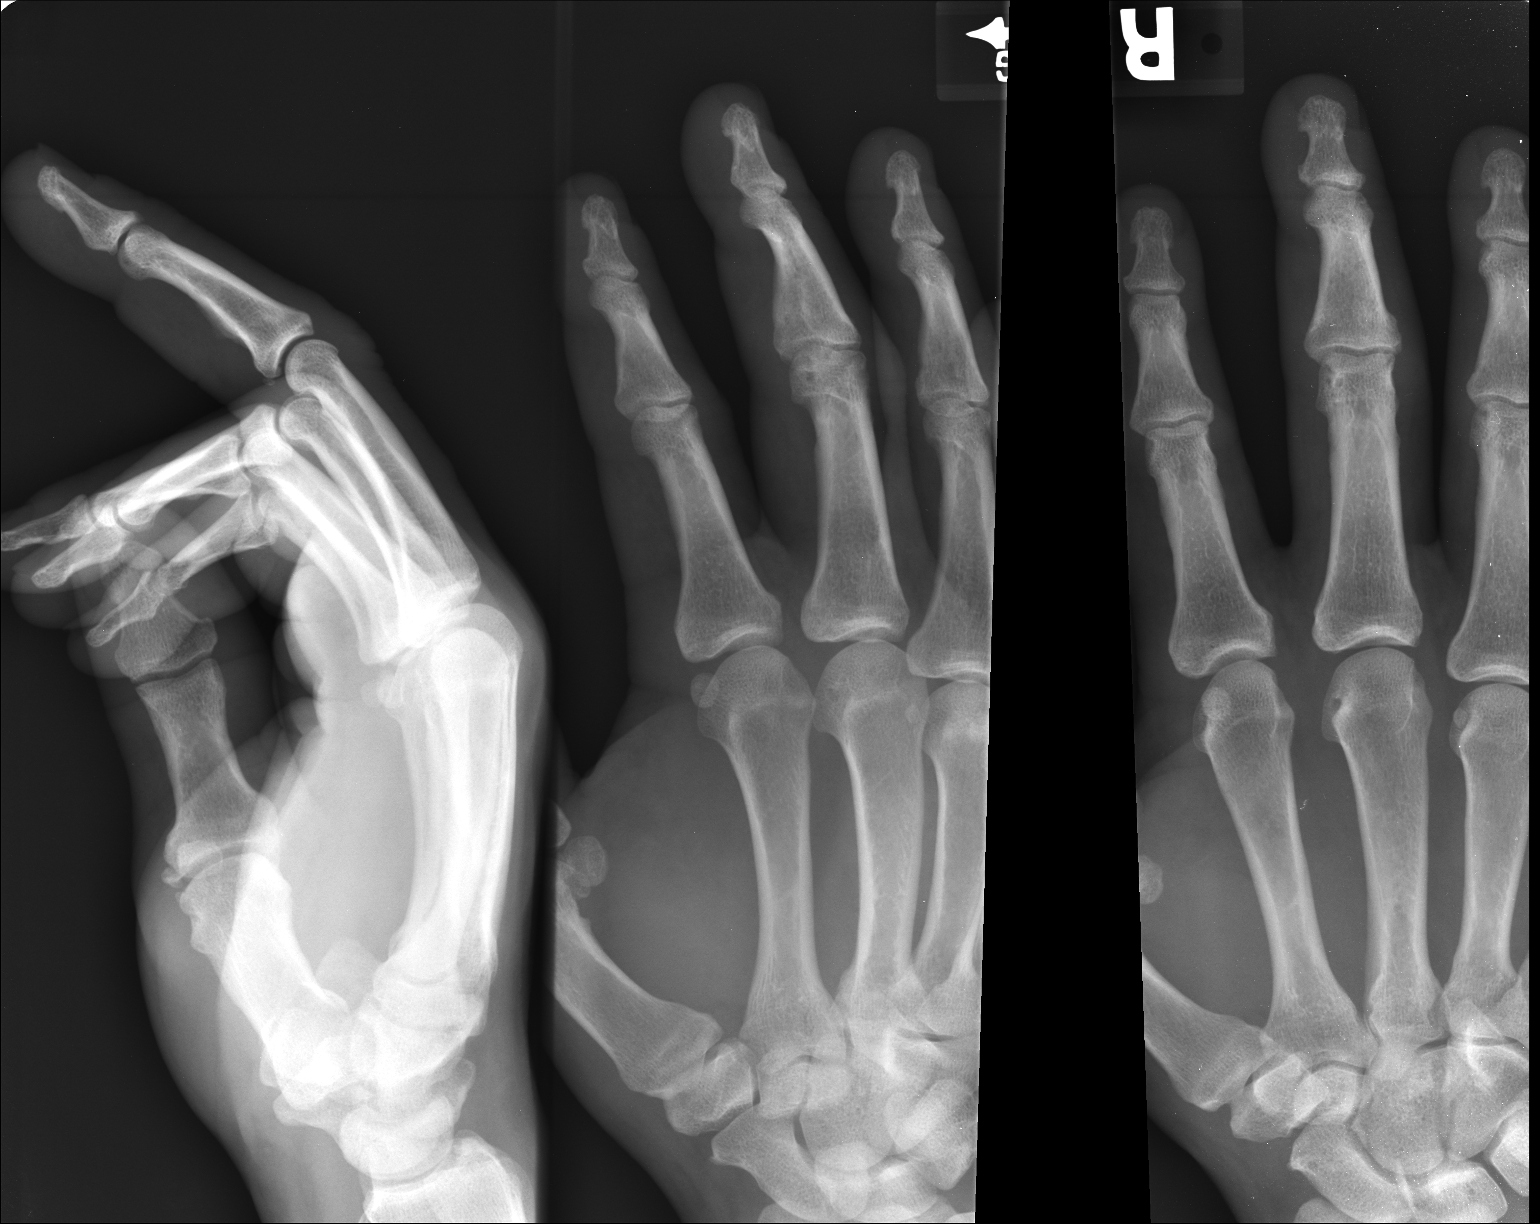

[1 of 1 positions shown; findings below may reference images not displayed]

FINDINGS: There is no evidence of fracture or dislocation. There is no
evidence of arthropathy or other focal bone abnormality. Soft
tissues are unremarkable.
IMPRESSION: Negative.

## 2018-01-21 ENCOUNTER — Emergency Department (HOSPITAL_COMMUNITY)
Admission: EM | Admit: 2018-01-21 | Discharge: 2018-01-21 | Disposition: A | Discharge status: Home / Self Care | Payer: 59 | Attending: Emergency Medicine | Admitting: Emergency Medicine

## 2018-01-21 ENCOUNTER — Other Ambulatory Visit: Payer: Self-pay

## 2018-01-21 DIAGNOSIS — Z87891 Personal history of nicotine dependence: Secondary | ICD-10-CM | POA: Insufficient documentation

## 2018-01-21 DIAGNOSIS — R1013 Epigastric pain: Secondary | ICD-10-CM | POA: Insufficient documentation

## 2018-01-21 LAB — CBC WITH DIFFERENTIAL/PLATELET
Abs Immature Granulocytes: 0.01 10*3/uL (ref 0.00–0.07)
BASOS ABS: 0 10*3/uL (ref 0.0–0.1)
BASOS PCT: 0 %
EOS ABS: 0.1 10*3/uL (ref 0.0–0.5)
EOS PCT: 1 %
HEMATOCRIT: 44.5 % (ref 39.0–52.0)
Hemoglobin: 14.3 g/dL (ref 13.0–17.0)
Immature Granulocytes: 0 %
LYMPHS ABS: 2.4 10*3/uL (ref 0.7–4.0)
Lymphocytes Relative: 39 %
MCH: 26.6 pg (ref 26.0–34.0)
MCHC: 32.1 g/dL (ref 30.0–36.0)
MCV: 82.9 fL (ref 80.0–100.0)
MONOS PCT: 9 %
Monocytes Absolute: 0.6 10*3/uL (ref 0.1–1.0)
NRBC: 0 % (ref 0.0–0.2)
Neutro Abs: 3 10*3/uL (ref 1.7–7.7)
Neutrophils Relative %: 51 %
PLATELETS: 291 10*3/uL (ref 150–400)
RBC: 5.37 MIL/uL (ref 4.22–5.81)
RDW: 13.2 % (ref 11.5–15.5)
WBC: 6.1 10*3/uL (ref 4.0–10.5)

## 2018-01-21 LAB — LIPASE, BLOOD: LIPASE: 26 U/L (ref 11–51)

## 2018-01-21 LAB — COMPREHENSIVE METABOLIC PANEL
ALBUMIN: 3.6 g/dL (ref 3.5–5.0)
ALK PHOS: 78 U/L (ref 38–126)
ALT: 16 U/L (ref 0–44)
ANION GAP: 3 — AB (ref 5–15)
AST: 16 U/L (ref 15–41)
BUN: 5 mg/dL — ABNORMAL LOW (ref 6–20)
CALCIUM: 9.3 mg/dL (ref 8.9–10.3)
CO2: 30 mmol/L (ref 22–32)
Chloride: 107 mmol/L (ref 98–111)
Creatinine, Ser: 1.04 mg/dL (ref 0.61–1.24)
GFR calc Af Amer: >60 mL/min (ref >60)
GLUCOSE: 89 mg/dL (ref 70–99)
POTASSIUM: 3.4 mmol/L — AB (ref 3.5–5.1)
Sodium: 140 mmol/L (ref 135–145)
Total Bilirubin: 0.5 mg/dL (ref 0.3–1.2)
Total Protein: 6.7 g/dL (ref 6.5–8.1)

## 2018-01-21 LAB — I-STAT TROPONIN, ED: Troponin i, poc: 0 ng/mL (ref 0.00–0.08)

## 2018-01-21 MED ORDER — OMEPRAZOLE 20 MG PO CPDR: 20.0000 mg | DELAYED_RELEASE_CAPSULE | Freq: Every day | ORAL | 0 refills | Status: AC

## 2018-01-21 MED ORDER — LIDOCAINE VISCOUS HCL 2 % MT SOLN
15.0000 mL | Freq: Once | OROMUCOSAL | Status: AC
  Administered 2018-01-21: 15 mL via ORAL
  Filled 2018-01-21: qty 15

## 2018-01-21 MED ORDER — ALUM & MAG HYDROXIDE-SIMETH 200-200-20 MG/5ML PO SUSP
30.0000 mL | Freq: Once | ORAL | Status: AC
  Administered 2018-01-21: 30 mL via ORAL
  Filled 2018-01-21: qty 30

## 2018-01-21 MED ORDER — FAMOTIDINE 20 MG PO TABS
20.0000 mg | ORAL_TABLET | Freq: Once | ORAL | Status: AC
  Administered 2018-01-21: 20 mg via ORAL
  Filled 2018-01-21: qty 1

## 2018-01-21 NOTE — Discharge Instructions (Addendum)
You have been seen today for abdominal pain. Please read and follow all provided instructions.   1. Medications: Prilosec, usual home medications 2. Treatment: rest, drink plenty of fluids, lifestyle modifications (avoid laying down after eating, spicy foods, etc) 3. Follow Up: Please follow up with your primary doctor in 2 days for discussion of your diagnoses and further evaluation after today's visit; if you do not have a primary care doctor use the resource guide provided to find one; Please return to the ER for any new or worsening symptoms.   Take medications as prescribed. Return to the emergency room for worsening condition or new concerning symptoms. Follow up with your regular doctor. If you don't have a regular doctor use one of the numbers below to establish a primary care doctor.   Emergency Department Resource Guide 1) Find a Doctor and Pay Out of Pocket Although you won't have to find out who is covered by your insurance plan, it is a good idea to ask around and get recommendations. You will then need to call the office and see if the doctor you have chosen will accept you as a new patient and what types of options they offer for patients who are self-pay. Some doctors offer discounts or will set up payment plans for their patients who do not have insurance, but you will need to ask so you aren't surprised when you get to your appointment.  2) Contact Your Local Health Department Not all health departments have doctors that can see patients for sick visits, but many do, so it is worth a call to see if yours does. If you don't know where your local health department is, you can check in your phone book. The CDC also has a tool to help you locate your state's health department, and many state websites also have listings of all of their local health departments.  3) Find a Walk-in Clinic If your illness is not likely to be very severe or complicated, you may want to try a walk in clinic.  These are popping up all over the country in pharmacies, drugstores, and shopping centers. They're usually staffed by nurse practitioners or physician assistants that have been trained to treat common illnesses and complaints. They're usually fairly quick and inexpensive. However, if you have serious medical issues or chronic medical problems, these are probably not your best option.  No Primary Care Doctor: Call Health Connect at  [phone] - they can help you locate a primary care doctor that  accepts your insurance, provides certain services, etc. Physician Referral [phone]  Emergency Department Resource Guide 1) Find a Doctor and Pay Out of Pocket Although you won't have to find out who is covered by your insurance plan, it is a good idea to ask around and get recommendations. You will then need to call the office and see if the doctor you have chosen will accept you as a new patient and what types of options they offer for patients who are self-pay. Some doctors offer discounts or will set up payment plans for their patients who do not have insurance, but you will need to ask so you aren't surprised when you get to your appointment.  2) Contact Your Local Health Department Not all health departments have doctors that can see patients for sick visits, but many do, so it is worth a call to see if yours does. If you don't know where your local health department is, you can check in your phone  book. The CDC also has a tool to help you locate your state's health department, and many state websites also have listings of all of their local health departments.  3) Find a Walk-in Clinic If your illness is not likely to be very severe or complicated, you may want to try a walk in clinic. These are popping up all over the country in pharmacies, drugstores, and shopping centers. They're usually staffed by nurse practitioners or physician assistants that have been trained to treat common  illnesses and complaints. They're usually fairly quick and inexpensive. However, if you have serious medical issues or chronic medical problems, these are probably not your best option.  No Primary Care Doctor: Call Health Connect at  [phone] - they can help you locate a primary care doctor that  accepts your insurance, provides certain services, etc. Physician Referral Service- [phone]  Chronic Pain Problems: Organization         Address  Phone   Notes  [facility]  [phone] Patients need to be referred by their primary care doctor.   Medication Assistance: Organization         Address  Phone   Notes  [facility] Medication [facility] [address] [place], [place] [address] [phone] --Must be a resident of [facility] -- Must have NO insurance coverage whatsoever (no Medicaid/ Medicare, etc.) -- The pt. MUST have a primary care doctor that directs their care regularly and follows them in the community   [facility]  [phone]   [employer]  [phone]    Agencies that provide inexpensive medical care: Organization         Address  Phone   Notes  [facility]  [phone]   [facility]    [phone]   [facility] [address] [place], [place] [address] [phone]   [facility] [address], [place] [phone]   [facility]    [phone]   [facility]    [phone]   [facility]  [address], [place] Phone:  [phone], Fax:  [phone] Hours of Operation:  9 am - 6 pm, M-F.  Also accepts Medicaid/Medicare and self-pay.  [facility]  [address], [place] Phone: [phone], Fax: [phone]. Hours of Operation:  8:30 am - 5:30 pm, M-F.  Also accepts Medicaid and self-pay.  [facility] [place] [address], [place] Phone: [phone]   [facility] [address] Natasha Bence [place], [place] [phone], Ext. [phone] Mondays & Thursdays: 7-9 AM.  First 15 patients are seen on a first come, first serve basis.    Medicaid-accepting [facility] Providers:  Organization         Address  Phone   Notes  [facility] [address] [place] [address], [place] [phone] Also accepts self-pay patients.  [facility] [address] Laurell Josephs [place], [place]  [phone]   [facility] [address], [place] [phone]   [facility] [address], [place] [phone]   Renaye Rakers [address], [place]   [phone] Only accepts [place] patients after they have their name applied to their card.   Self-Pay (no  insurance) in [place]:  Organization         Address  Phone   Notes  Sickle Cell Patients, [facility] [address] [place], [place] [phone]   [facility] [address] [place], [place] [phone]   [facility]  [address], [place] [phone]   [facility]/Dr. Osei-Bonsu  [address], [place] or [address], [place] [phone] Phone number for both [place] and [place] locations is the same.  [facility] [place] [address], [place] [phone]   [facility] [address], [place] or [address] [phone]   [facility] [address], [place] [phone], phone; [phone], fax Sees patients 1st and 3rd Saturday of every month.  Must not qualify for public or private insurance (i.e. Medicaid, Medicare, [place] Health Choice, Veterans' Benefits)  Household income should be no more than 200% of the poverty  level The clinic cannot treat you if you are pregnant or think you are pregnant  Sexually transmitted diseases are not treated at the clinic.

## 2018-01-21 NOTE — ED Triage Notes (Signed)
Pt to ED for evaluation of intermittent mid stomach pain x 7-8 years. Pt states pain is worse after he drinks something hot. Episodes go away by themselves. Patient has not thrown up in a month. No diarrhea. He states it sometimes travels up into his chest like heartburn. He takes Nexium for these symptoms with relief but has not taken anything today. States he came in today because he is tired of it hurting.

## 2018-01-21 NOTE — ED Notes (Signed)
Patient verbalizes understanding of discharge instructions. Opportunity for questioning and answers were provided. Armband removed by staff, pt discharged from ED ambulatory.   

## 2018-01-21 NOTE — ED Provider Notes (Signed)
[facility]   CSN: [id number] Arrival date & time: 01/21/18  1304     History   Chief Complaint Chief Complaint  Patient presents with  . Abdominal Pain    HPI Tristan Ware is a 47 y.o. male with a PMH of GERD presenting with epigastric abdominal pain onset 1 month ago. Patient describes abdominal pain as burning and it travels to the middle of his chest. Patient states pain is intermittent with episodes lasting about 5 minutes. Patient reports he is not currently in pain. Patient reports Zantac and Nexium alleviates the pain and drinking hot fluids aggravates the pain. Patient reports one episode of vomiting one month ago, but denies any vomiting since then. Patient reports last episode of abdominal pain was this morning at 9am. Patient denies history of alcohol or drug use. Patient reports occasional tobacco use. Patient reports he is very active and eats healthy foods. Last BM was yesterday. Patient has a family history of HTN, diabetes, and CHF.  HPI  Past Medical History:  Diagnosis Date  . Acid reflux   . Ulcer (HCC)     There are no active problems to display for this patient.   Past Surgical History:  Procedure Laterality Date  . BACK SURGERY    . bilateral knee surgery    . SPINE SURGERY          Home Medications    Prior to Admission medications   Medication Sig Start Date End Date Taking? Authorizing Provider  omeprazole (PRILOSEC) 20 MG capsule Take 1 capsule (20 mg total) by mouth daily for 14 days. 01/21/18 02/04/18  Leretha DykesHernandez, Ross Bender P, PA-C    Family History Family History  Problem Relation Age of Onset  . Diabetes Mother   . Hypertension Mother   . Heart disease Mother 6850       CABG/CAD  . Stroke Father [id number]       CVA  . Hypertension Father   . Hyperlipidemia Father   . Diabetes Brother   . Hypertension Brother     Social History Social History   Tobacco Use  . Smoking status:  Former Smoker    Last attempt to quit: 07/29/2011    Years since quitting: 6.4  Substance Use Topics  . Alcohol use: No  . Drug use: No     Allergies   Aspirin   Review of Systems Review of Systems  Constitutional: Negative for activity change, appetite change, chills, fever and unexpected weight change.  HENT: Negative for congestion, rhinorrhea and sore throat.   Eyes: Negative for visual disturbance.  Respiratory: Negative for cough and shortness of breath.   Cardiovascular: Negative for chest pain, palpitations and leg swelling.  Gastrointestinal: Positive for abdominal pain and vomiting. Negative for constipation, diarrhea and nausea.  Endocrine: Negative for polydipsia, polyphagia and polyuria.  Genitourinary: Negative for dysuria, flank pain and frequency.  Musculoskeletal: Negative for back pain.  Skin: Negative for rash.  Allergic/Immunologic: Negative for immunocompromised state.  Psychiatric/Behavioral: The patient is not nervous/anxious.      Physical Exam Updated Vital Signs BP (!) 154/98 (BP Location: Right Arm)   Pulse (!) 58   Temp 98.3 F (36.8 C) (Oral)   Resp 16   Ht 6\' 1"  (1.854 m)   Wt 104.3 kg   SpO2 100%   BMI 30.34 kg/m   Physical Exam  Constitutional: He appears well-developed and well-nourished. No distress.  HENT:  Head: Normocephalic and atraumatic.  Neck: Normal range of motion. Neck supple.  Cardiovascular: Normal rate, regular rhythm and normal heart sounds. Exam reveals no gallop and no friction rub.  No murmur heard. Pulmonary/Chest: Effort normal and breath sounds normal. No respiratory distress. He has no wheezes. He has no rales.  Abdominal: Soft. Normal appearance and bowel sounds are normal. He exhibits no distension and no mass. There is no tenderness. There is no rigidity, no rebound, no guarding and no CVA tenderness. No hernia.  Musculoskeletal: Normal range of motion.  Neurological: He is alert.  Skin: Skin is warm. No  rash noted. He is not diaphoretic.  Psychiatric: He has a normal mood and affect.  Nursing note and vitals reviewed.       ED Treatments / Results  Labs (all labs ordered are listed, but only abnormal results are displayed) Labs Reviewed  COMPREHENSIVE METABOLIC PANEL - Abnormal; Notable for the following components:      Result Value   Potassium 3.4 (*)    BUN 5 (*)    Anion gap 3 (*)    All other components within normal limits  LIPASE, BLOOD  CBC WITH DIFFERENTIAL/PLATELET  I-STAT TROPONIN, ED    EKG EKG Interpretation  Date/Time:  Tuesday January 21 2018 13:24:45 EST Ventricular Rate:  71 PR Interval:    QRS Duration: 91 QT Interval:  396 QTC Calculation: 431 R Axis:   -95 Text Interpretation:  Sinus rhythm Prolonged PR interval Left anterior fascicular block Borderline T abnormalities, diffuse leads Baseline wander in lead(s) V6 Confirmed by Lutricia Feil [phone]) on 01/21/2018 1:28:40 PM   Radiology No results found.  Procedures Procedures (including critical care time)  Medications Ordered in ED Medications  famotidine (PEPCID) tablet 20 mg (20 mg Oral Given 01/21/18 1434)  alum & mag hydroxide-simeth (MAALOX/MYLANTA) 200-200-20 MG/5ML suspension 30 mL (30 mLs Oral Given 01/21/18 1434)    And  lidocaine (XYLOCAINE) 2 % viscous mouth solution 15 mL (15 mLs Oral Given 01/21/18 1434)     Initial Impression / Assessment and Plan / ED Course  I have reviewed the triage vital signs and the nursing notes.  Pertinent labs & imaging results that were available during my care of the patient were reviewed by me and considered in my medical decision making (see chart for details).  Clinical Course as of Jan 21 1457  Tue Jan 21, 2018  1425 Patient started experiencing an episode of abdominal pain. Provided Pepcid and Maalox.    [AH]  1426 Troponin is negative.   I-Stat Troponin, ED (not at [facility]) [AH]  1441 CBC, CMP, and lipase are unremarkable. Potassium is  slightly low, will encourage patient to make dietary changes.    [AH]    Clinical Course User Index [AH] Leretha Dykes, PA-C     Suspect symptoms are likely due to GERD due to history, physical exam, and normal labs. Did not require another troponin because symptoms are not cardiac in nature due to history, physical exam, and normal labs. Heart score is a 3, making patient a low risk. Discussed this decision with the patient. Advised patient to follow up with PCP. Will provide Prilosec for symptoms. Encouraged patient to follow up with GI.   Patient is nontoxic, nonseptic appearing, in no apparent distress.  Patient's pain and other symptoms adequately managed in emergency department.  Fluid bolus given.  Labs, imaging and vitals reviewed.  Patient does not meet the SIRS or Sepsis criteria.  On repeat exam patient does not have  a surgical abdomin and there are no peritoneal signs.  No indication of appendicitis, bowel obstruction, bowel perforation, cholecystitis, diverticulitis. Patient discharged home with symptomatic treatment and given strict instructions for follow-up with their primary care physician.  I have also discussed reasons to return immediately to the ER.  Patient expresses understanding and agrees with plan.   Final Clinical Impressions(s) / ED Diagnoses   Final diagnoses:  Epigastric pain    ED Discharge Orders         Ordered    omeprazole (PRILOSEC) 20 MG capsule  Daily     01/21/18 1454           Carlyle [place], [place] 01/21/18 1458    Cathren Laine, MD 01/24/18 2014
